# Patient Record
Sex: Female | Born: 1970 | Race: White | Hispanic: No | Marital: Single
Health system: Southern US, Community
[De-identification: ages and names within clinical notes are randomized; demographics above are authoritative.]

---

## 2007-12-31 ENCOUNTER — Other Ambulatory Visit: Admission: RE | Admit: 2007-12-31 | Discharge: 2007-12-31 | Payer: Self-pay | Admitting: Family Medicine

## 2009-08-09 ENCOUNTER — Ambulatory Visit: Payer: Self-pay | Admitting: Internal Medicine

## 2009-12-26 ENCOUNTER — Ambulatory Visit: Payer: Self-pay | Admitting: Gastroenterology

## 2010-07-09 IMAGING — MG MAM DGTL SCREENING MAMMO W/CAD
1 series · 4 of 4 positions shown · non-contrast
Comparison: none

REASON FOR EXAM: BASELINE
COMMENTS:

PROCEDURE:     MAM - MAM DGTL SCREENING MAMMO W/CAD  - August 09, 2009 [DATE]
RESULT:       Calcifications are noted on the right.  No mass lesions or
pathologic clustered calcifications demonstrated. CAD evaluation is
nonfocal.

[R CC · right · 4 of 4 slices shown]
[im 1/4]
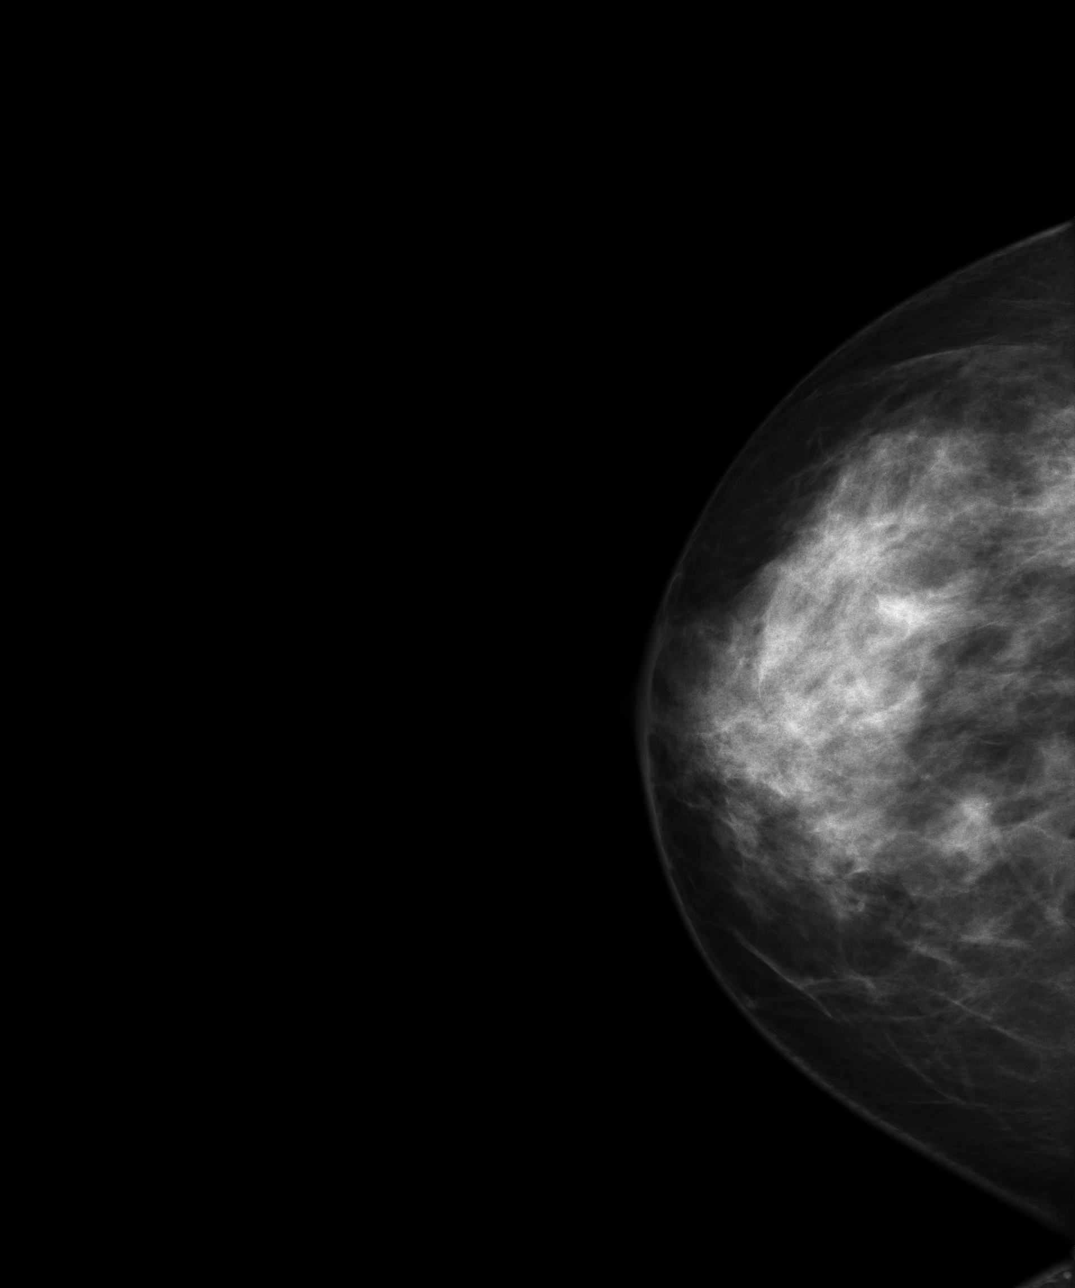
[im 2/4]
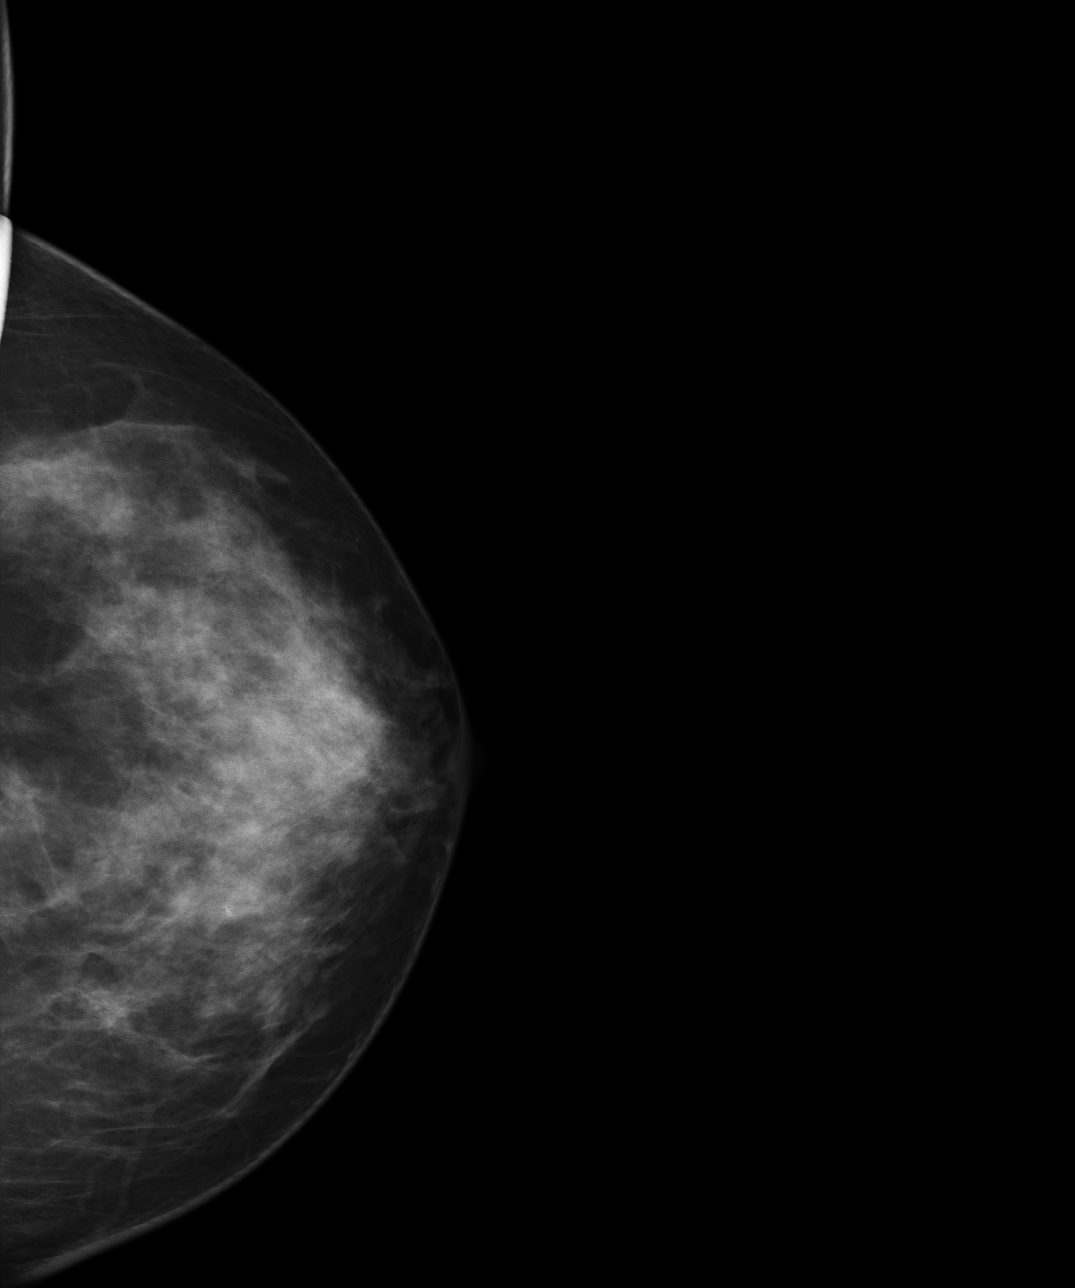
[im 3/4]
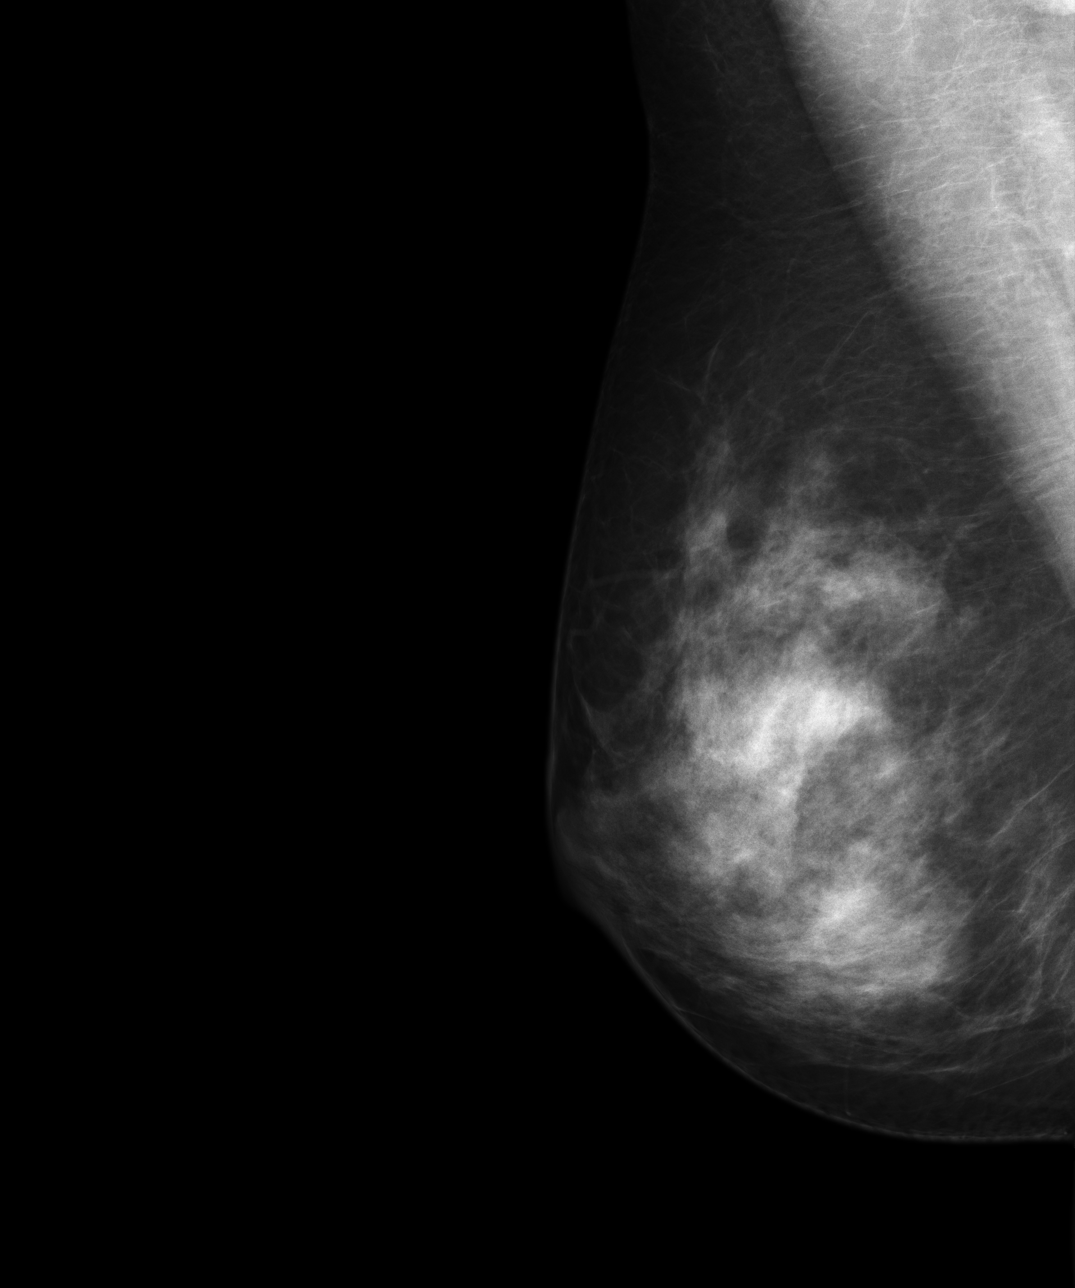
[im 4/4]
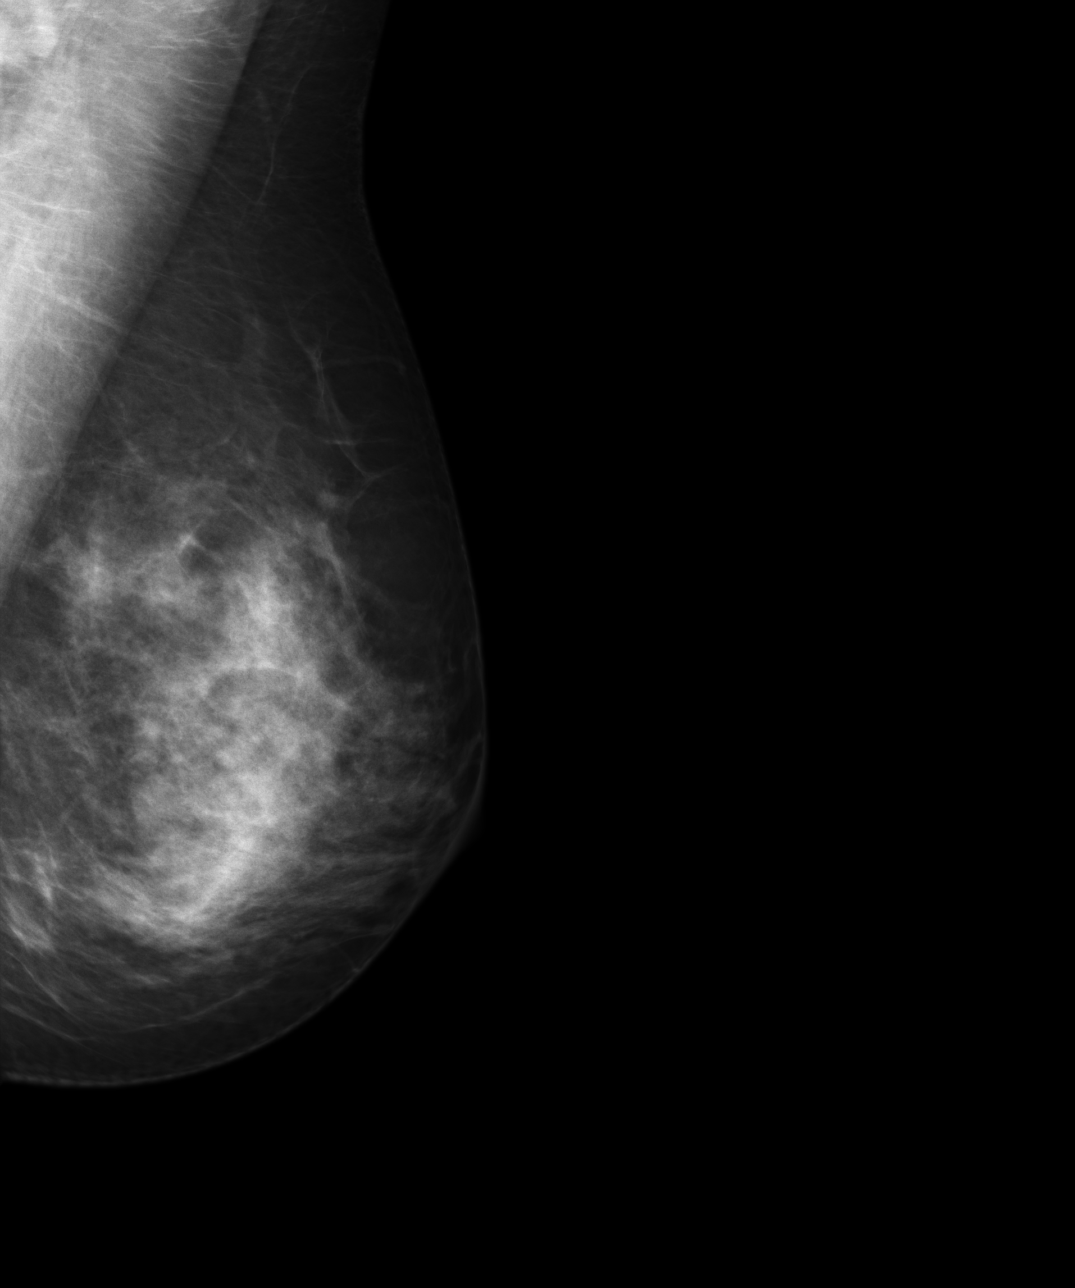

[4 of 4 positions shown; findings below may reference images not displayed]

IMPRESSION: 1.     Benign exam.
2.     Routine yearly follow up exam is suggested.
3.     BI-RADS:  Category 2- Benign Finding.

A negative mammogram report does not preclude biopsy or other evaluation of
a clinically palpable or otherwise suspicious mass or lesion. Breast cancer
may not be detected by mammography in up to 10% of cases.

## 2011-05-20 ENCOUNTER — Ambulatory Visit: Payer: Self-pay | Admitting: Internal Medicine

## 2012-04-18 IMAGING — MG MAM DGTL SCREENING MAMMO W/CAD
1 series · 4 of 4 positions shown · non-contrast
Comparison: none

REASON FOR EXAM: scr mammo
COMMENTS:

PROCEDURE:     MAM - MAM DGTL SCREENING MAMMO W/CAD  - May 20, 2011 [DATE]
RESULT:     COMPARISON:  08/09/2009
TECHNIQUE: Digital screening mammograms were obtained. FDA approved
computer-aided detection (CAD) for mammography was utilized for this study.

[Series 4246: R CC · right · 4 of 4 slices shown]
[im 1/4]
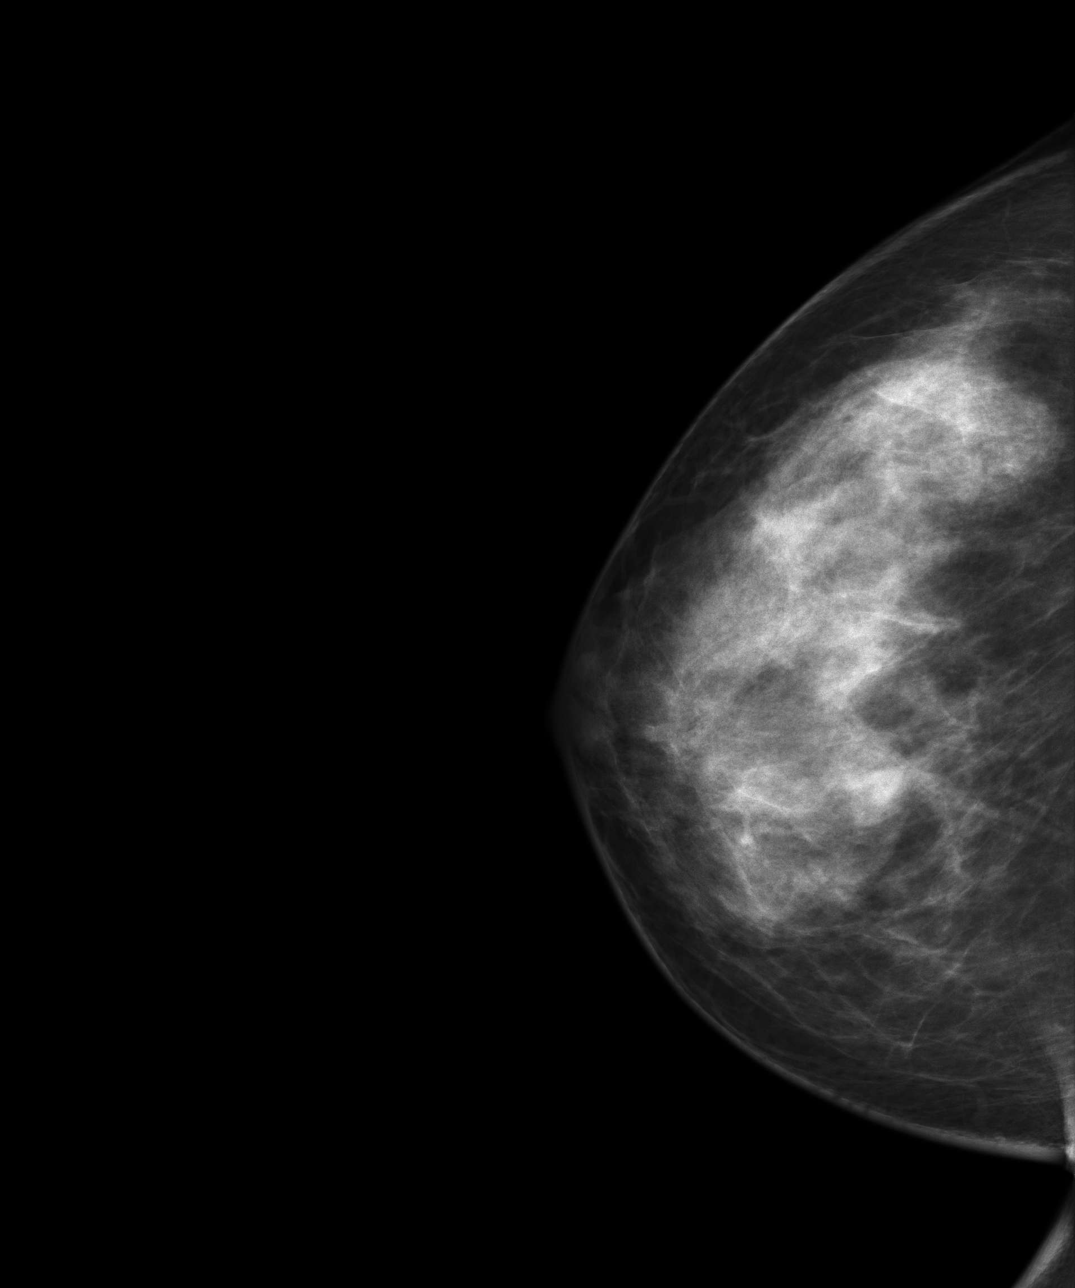
[im 2/4]
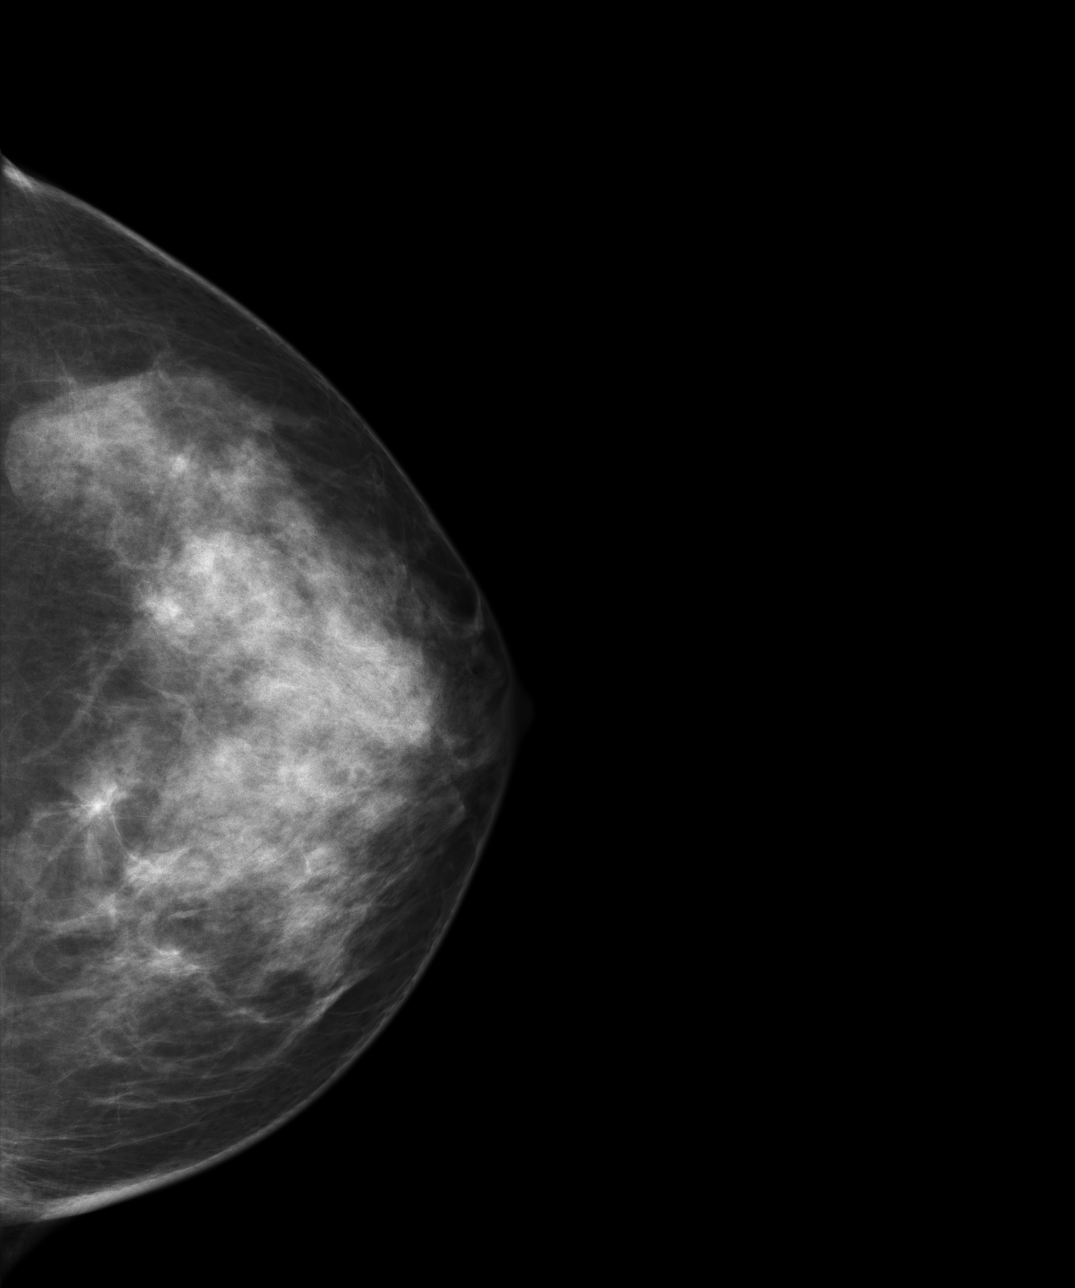
[im 3/4]
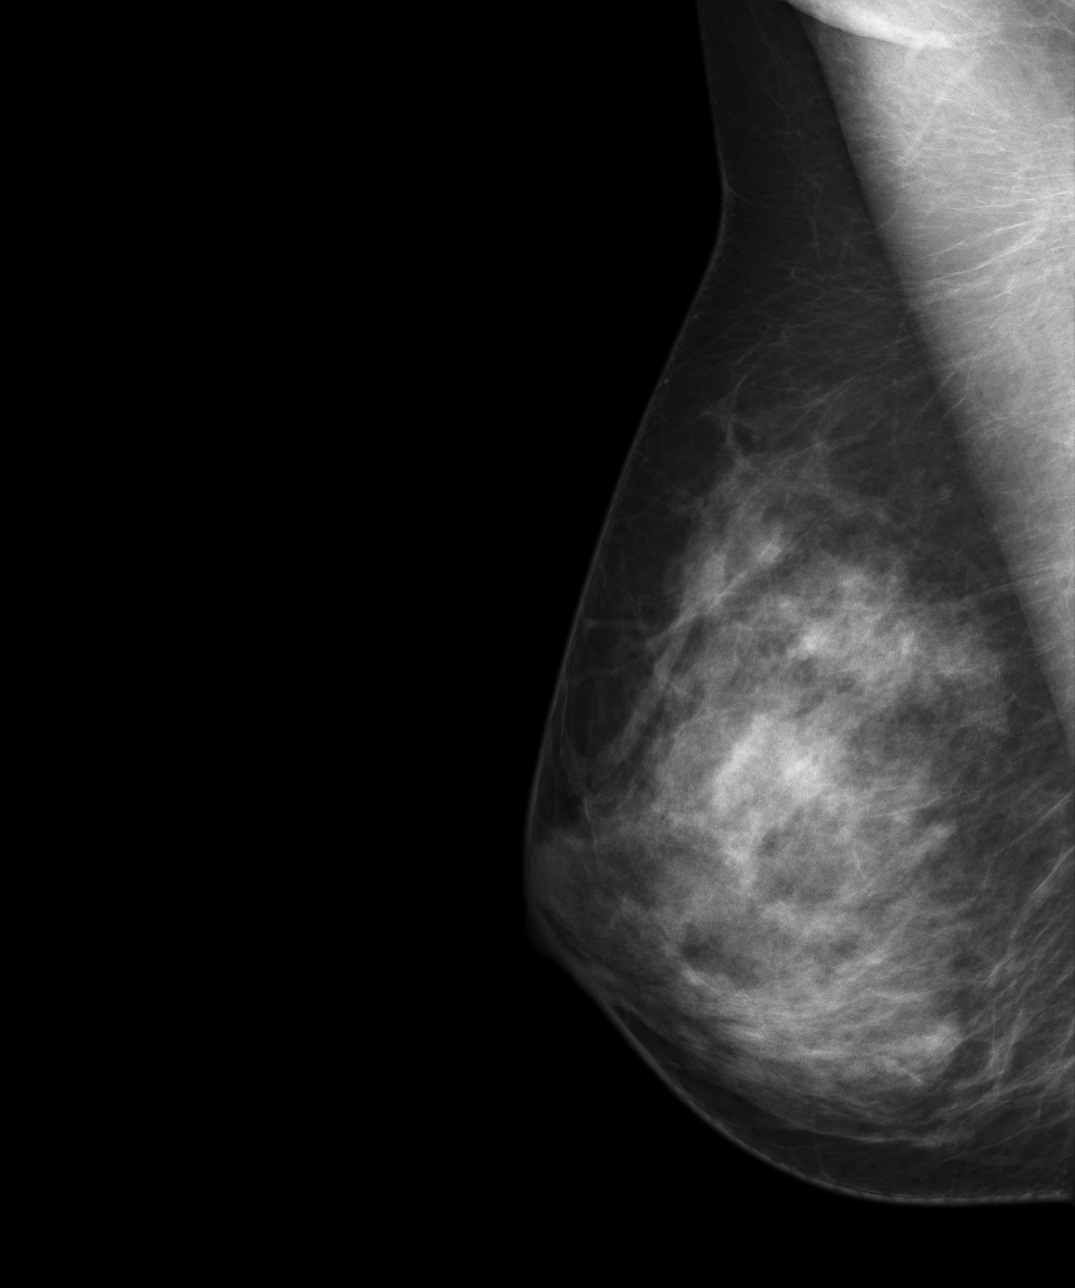
[im 4/4]
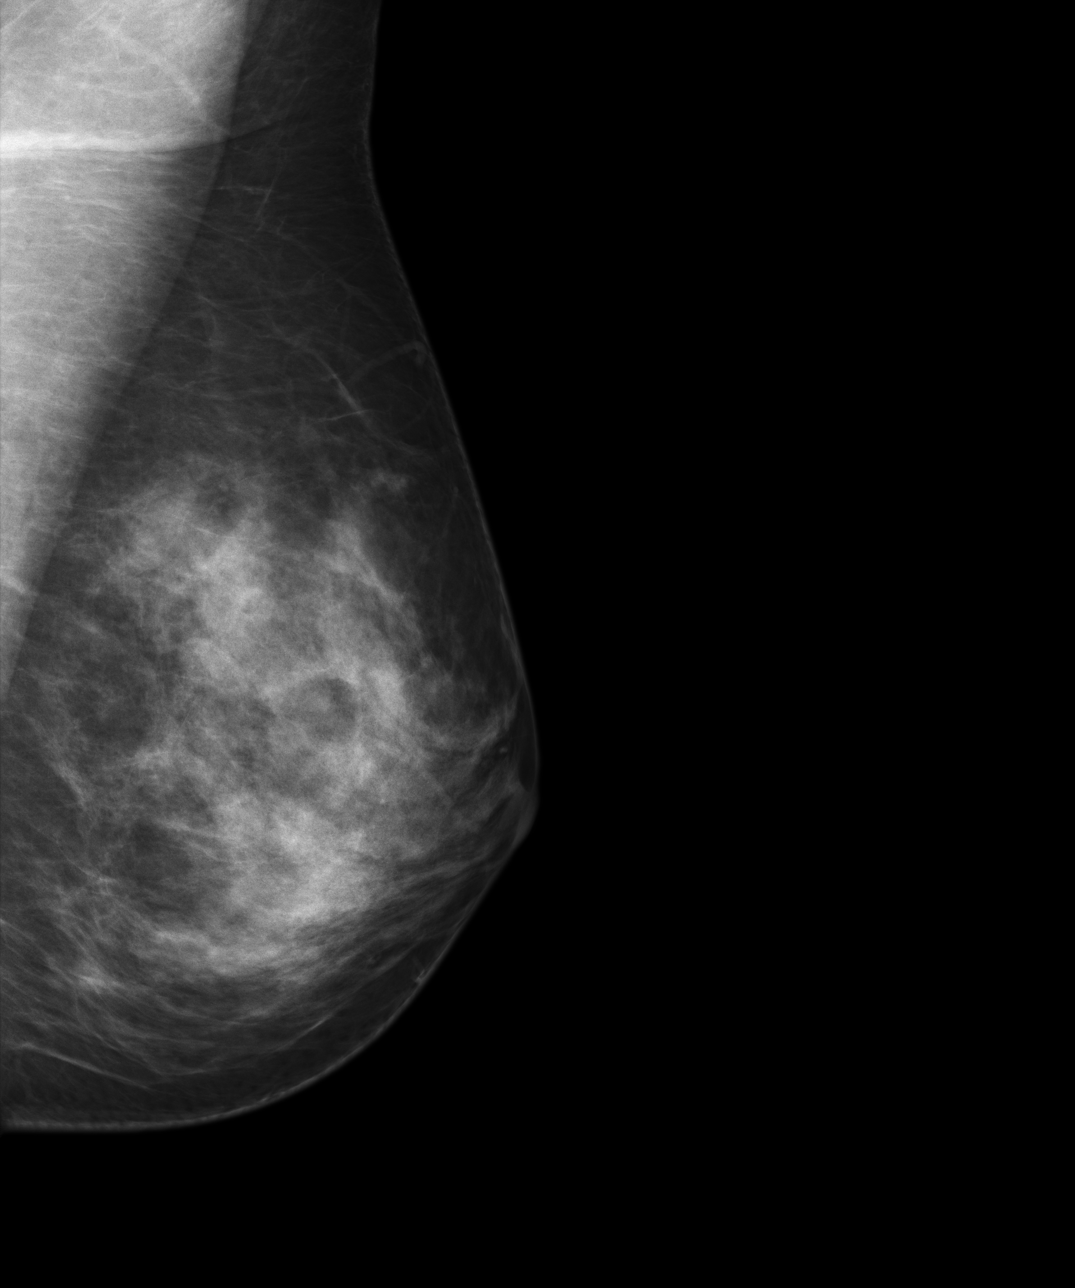

[4 of 4 positions shown; findings below may reference images not displayed]

FINDING: Bilateral breasts are heterogeneously dense which may lower the sensitivity
of mammography.  There is no dominant mass, architectural distortion or
clusters of suspicious microcalcifications.
IMPRESSION: 1.     Stable bilateral mammogram.
2.     Annual mammographic follow up recommended.
3.     BI-RADS:  Category 2- Benign.

A negative mammogram report does not preclude biopsy or other evaluation of
a clinically palpable or otherwise suspicious mass or lesion. Breast cancer
may not be detected by mammography in up to 10% of cases.

## 2012-12-10 ENCOUNTER — Ambulatory Visit: Payer: Self-pay | Admitting: Internal Medicine

## 2014-03-30 ENCOUNTER — Ambulatory Visit: Payer: Self-pay | Admitting: Internal Medicine

## 2015-02-27 IMAGING — MG MAM DGTL SCRN MAM NO ORDER W/CAD
1 series · 4 of 4 positions shown · non-contrast
Comparison: Previous exam(s).

CLINICAL DATA: Screening.

EXAM:
DIGITAL SCREENING BILATERAL MAMMOGRAM WITH CAD

[R CC · right · 4 of 4 slices shown]
[im 1/4]
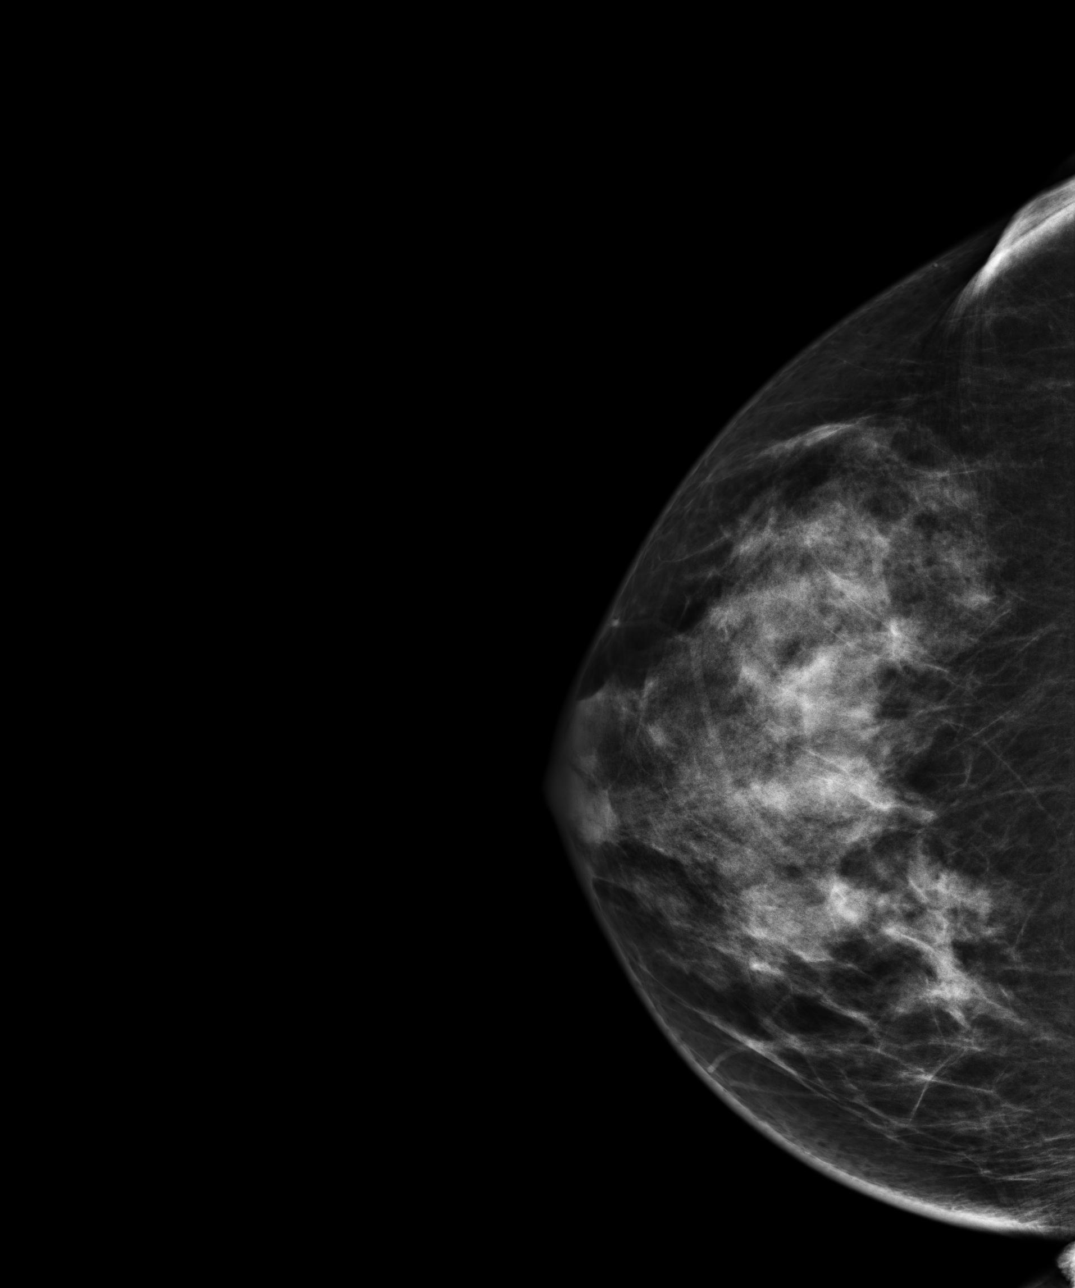
[im 2/4]
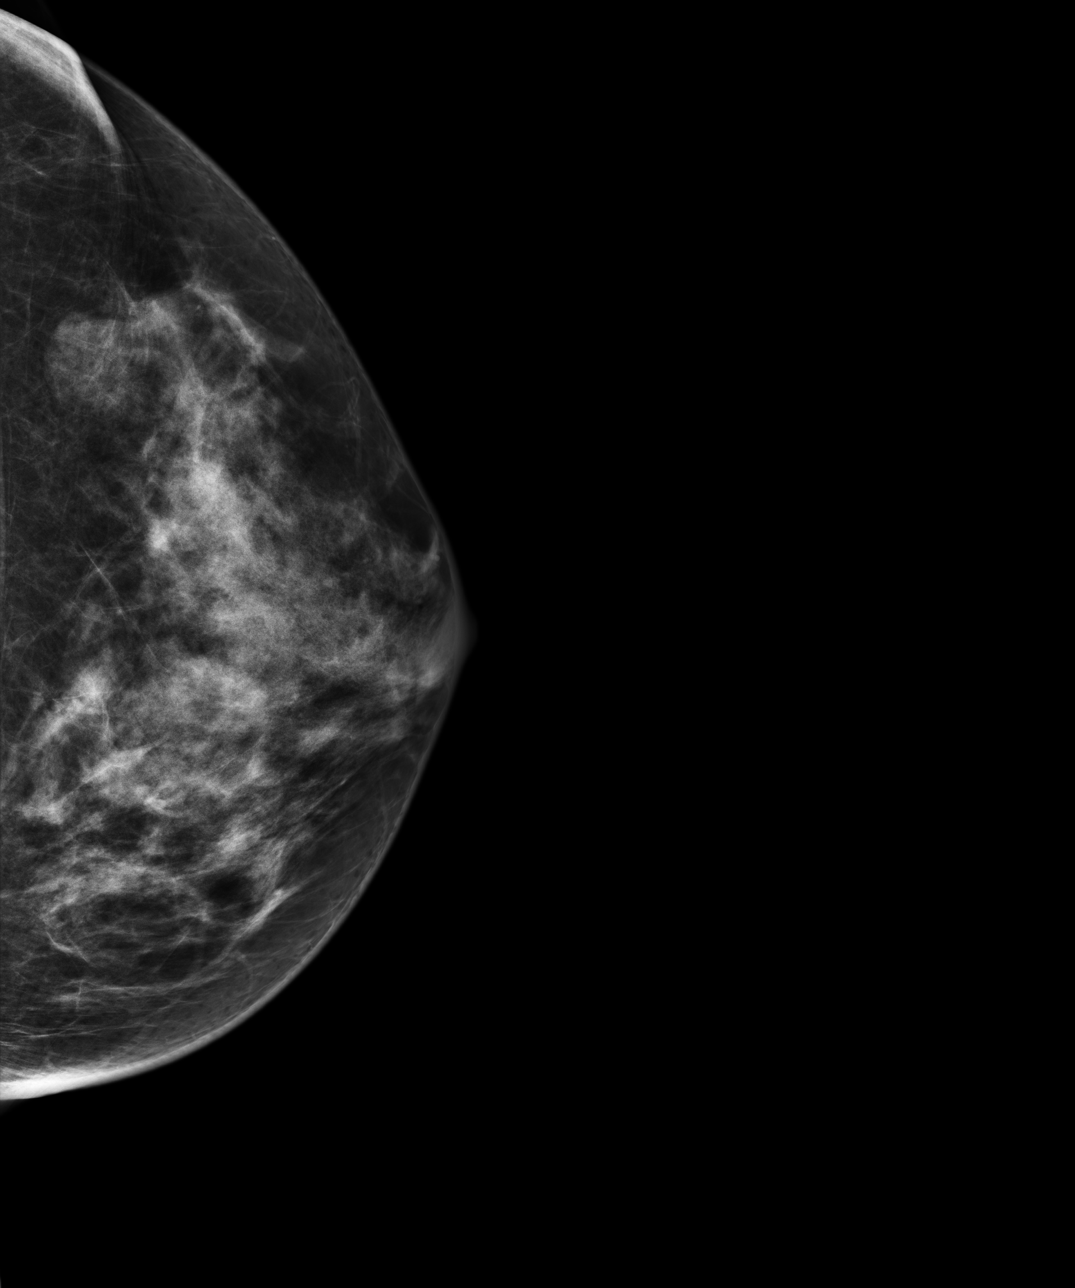
[im 3/4]
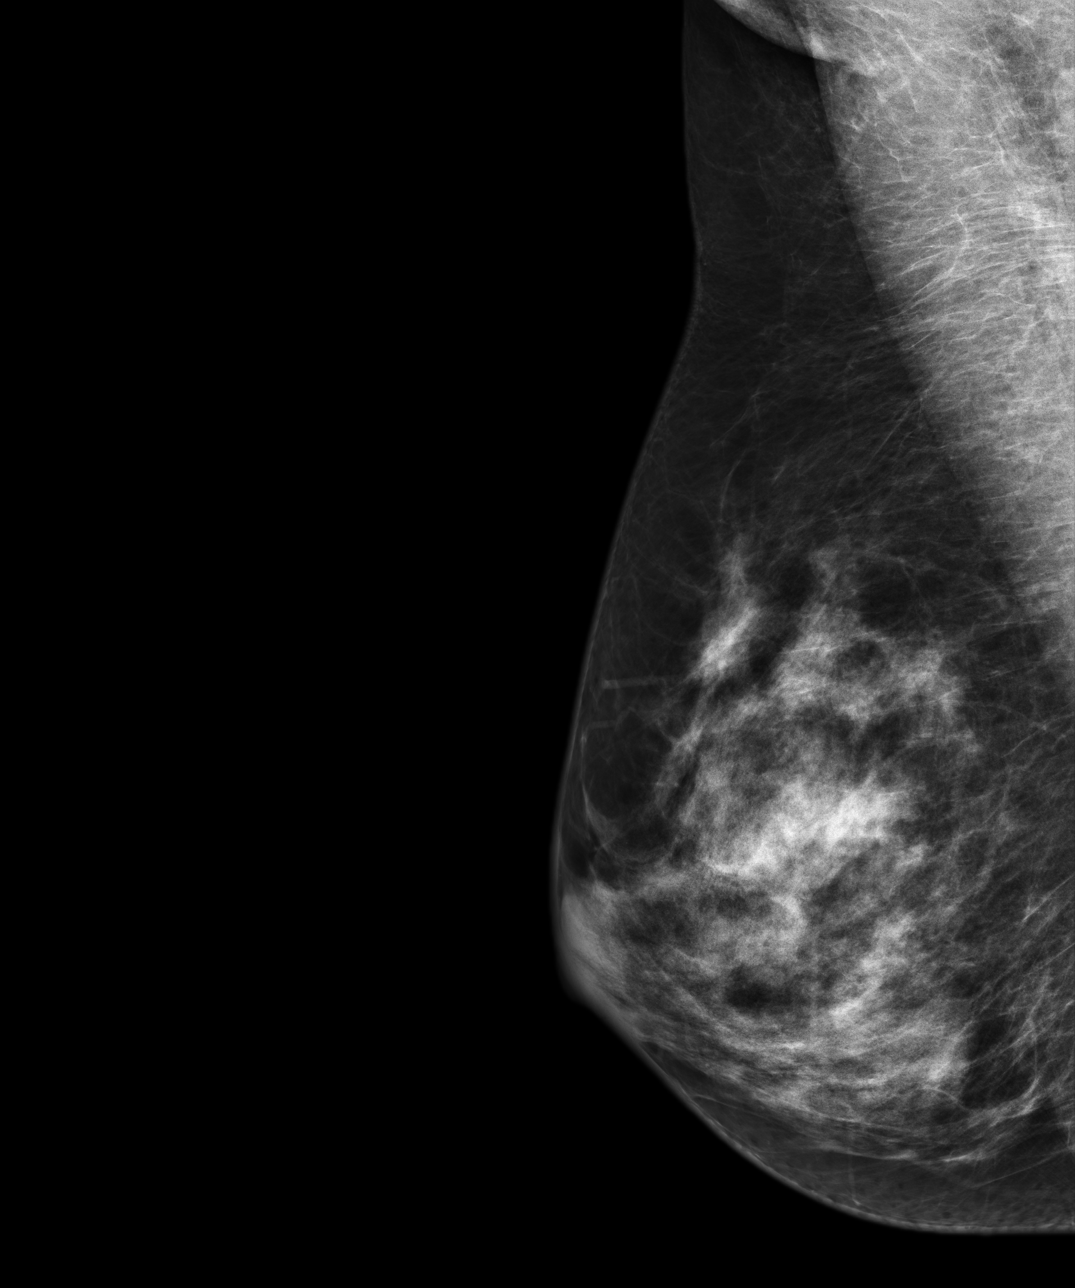
[im 4/4]
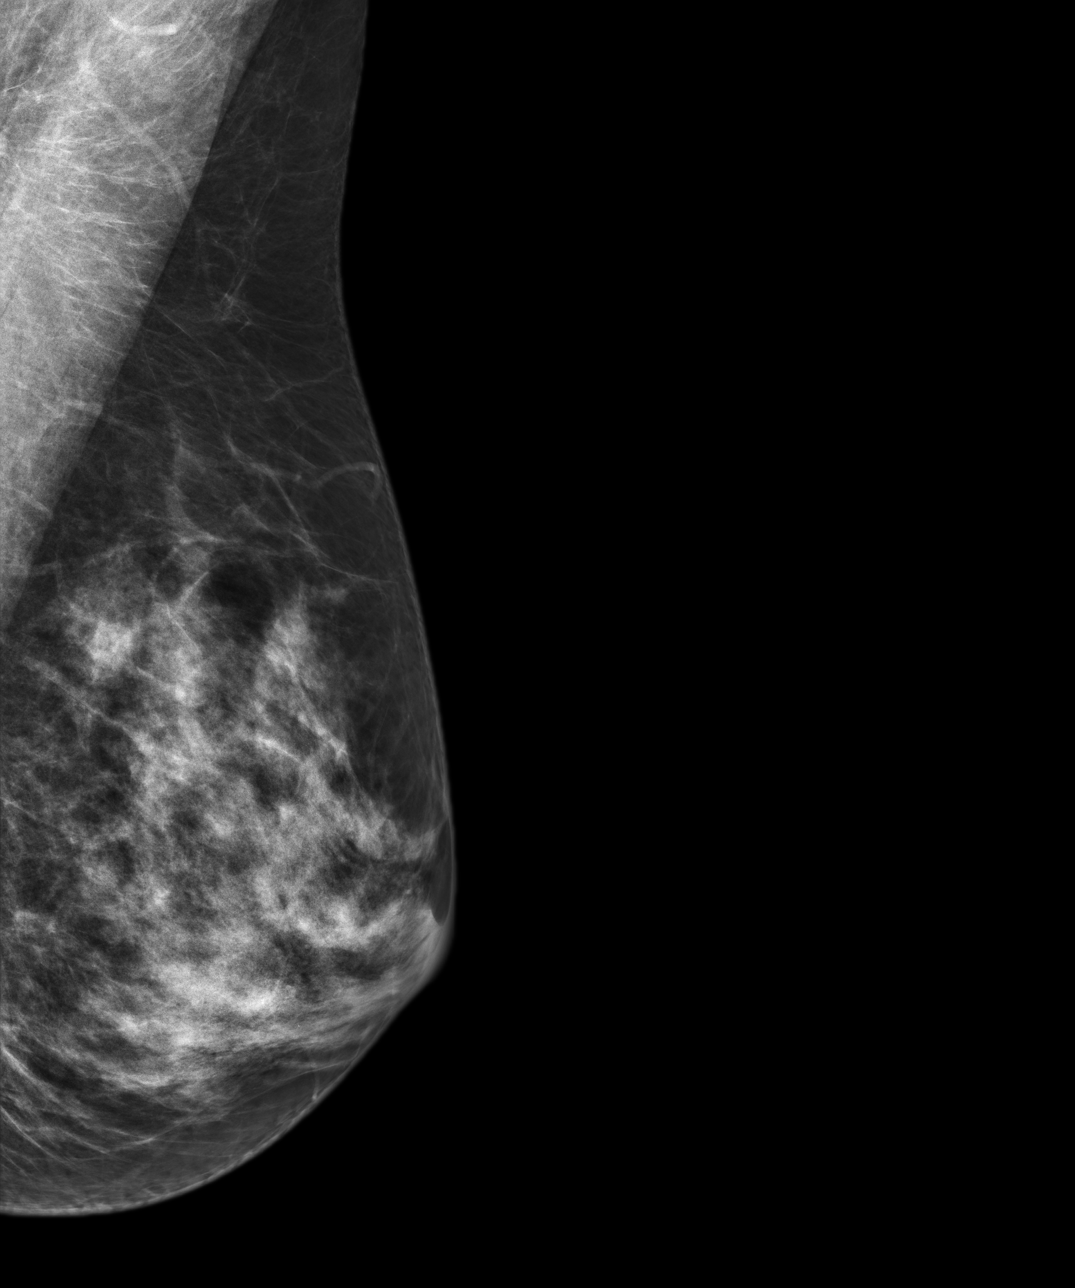

[4 of 4 positions shown; findings below may reference images not displayed]

ACR Breast Density Category c: The breast tissue is heterogeneously
dense, which may obscure small masses.
FINDINGS: There are no findings suspicious for malignancy. Images were
processed with CAD.
IMPRESSION: No mammographic evidence of malignancy. A result letter of this
screening mammogram will be mailed directly to the patient.

RECOMMENDATION:
Screening mammogram in one year. (Code:[id number])

BI-RADS CATEGORY  1: Negative.

## 2020-04-12 ENCOUNTER — Telehealth: Payer: Self-pay | Admitting: Nurse Practitioner

## 2020-04-12 NOTE — Telephone Encounter (Signed)
Called to discuss with Illana Chaviano about Covid symptoms and the use of casirivimab/imdevimab, a combination monoclonal antibody infusion for those with mild to moderate Covid symptoms and at a high risk of hospitalization.     Pt is qualified for this infusion at the [facility] infusion center due to co-morbid conditions (asthma).   Will need to speak with patient further to determine symptom onset. Unable to reach. Voicemail left.   Willette Alma, AGPCNP-BC

## 2020-04-13 ENCOUNTER — Other Ambulatory Visit: Payer: Self-pay | Admitting: Internal Medicine

## 2020-04-13 DIAGNOSIS — U071 COVID-19: Secondary | ICD-10-CM

## 2020-04-13 DIAGNOSIS — J45909 Unspecified asthma, uncomplicated: Secondary | ICD-10-CM

## 2020-04-13 NOTE — Progress Notes (Unsigned)
I connected by phone with Chelsea Boyd on 04/13/2020 at 10:11 AM to discuss the potential use of a new treatment for mild to moderate COVID-19 viral infection in non-hospitalized patients.  This patient is a 49 y.o. female that meets the FDA criteria for Emergency Use Authorization of COVID monoclonal antibody casirivimab/imdevimab.  Has a (+) direct SARS-CoV-2 viral test result  Has mild or moderate COVID-19   Is NOT hospitalized due to COVID-19  Is within 10 days of symptom onset  Has at least one of the high risk factor(s) for progression to severe COVID-19 and/or hospitalization as defined in EUA.  Specific high risk criteria : chronic asthma   I have spoken and communicated the following to the patient or parent/caregiver regarding COVID monoclonal antibody treatment:  1. FDA has authorized the emergency use for the treatment of mild to moderate COVID-19 in adults and pediatric patients with positive results of direct SARS-CoV-2 viral testing who are 50 years of age and older weighing at least 40 kg, and who are at high risk for progressing to severe COVID-19 and/or hospitalization.  2. The significant known and potential risks and benefits of COVID monoclonal antibody, and the extent to which such potential risks and benefits are unknown.  3. Information on available alternative treatments and the risks and benefits of those alternatives, including clinical trials.  4. Patients treated with COVID monoclonal antibody should continue to self-isolate and use infection control measures (e.g., wear mask, isolate, social distance, avoid sharing personal items, clean and disinfect "high touch" surfaces, and frequent handwashing) according to CDC guidelines.   5. The patient or parent/caregiver has the option to accept or refuse COVID monoclonal antibody treatment.  After reviewing this information with the patient, The patient agreed to proceed with receiving casirivimab\imdevimab  infusion and will be provided a copy of the Fact sheet prior to receiving the infusion.   Mab infusion scheduled for 9/3 at 0830.  This will be day 6 of symptom onset.  Patient given appointment details and sent MyChart activation code for further details to be sent.  Marcy Salvo, NP 04/13/2020 10:11 AM

## 2020-04-14 ENCOUNTER — Ambulatory Visit (HOSPITAL_COMMUNITY)
Admission: RE | Admit: 2020-04-14 | Discharge: 2020-04-14 | Disposition: A | Discharge status: Home / Self Care | Payer: 59 | Source: Ambulatory Visit | Attending: Pulmonary Disease | Admitting: Pulmonary Disease

## 2020-04-14 DIAGNOSIS — J45909 Unspecified asthma, uncomplicated: Secondary | ICD-10-CM | POA: Insufficient documentation

## 2020-04-14 DIAGNOSIS — U071 COVID-19: Secondary | ICD-10-CM | POA: Diagnosis present

## 2020-04-14 MED ORDER — METHYLPREDNISOLONE SODIUM SUCC 125 MG IJ SOLR: 125.0000 mg | Freq: Once | INTRAMUSCULAR | Status: DC | PRN

## 2020-04-14 MED ORDER — DIPHENHYDRAMINE HCL 50 MG/ML IJ SOLN: 50.0000 mg | Freq: Once | INTRAMUSCULAR | Status: DC | PRN

## 2020-04-14 MED ORDER — SODIUM CHLORIDE 0.9 % IV SOLN: INTRAVENOUS | Status: DC | PRN

## 2020-04-14 MED ORDER — ALBUTEROL SULFATE HFA 108 (90 BASE) MCG/ACT IN AERS: 2.0000 | INHALATION_SPRAY | Freq: Once | RESPIRATORY_TRACT | Status: DC | PRN

## 2020-04-14 MED ORDER — FAMOTIDINE IN NACL 20-0.9 MG/50ML-% IV SOLN: 20.0000 mg | Freq: Once | INTRAVENOUS | Status: DC | PRN

## 2020-04-14 MED ORDER — EPINEPHRINE 0.3 MG/0.3ML IJ SOAJ: 0.3000 mg | Freq: Once | INTRAMUSCULAR | Status: DC | PRN

## 2020-04-14 MED ORDER — SODIUM CHLORIDE 0.9 % IV SOLN
1200.0000 mg | Freq: Once | INTRAVENOUS | Status: AC
  Administered 2020-04-14: 1200 mg via INTRAVENOUS
  Filled 2020-04-14: qty 10

## 2020-04-14 NOTE — Discharge Instructions (Signed)
10 Things You Can Do to Manage Your COVID-19 Symptoms at Home If you have possible or confirmed COVID-19: 1. Stay home from work and school. And stay away from other public places. If you must go out, avoid using any kind of public transportation, ridesharing, or taxis. 2. Monitor your symptoms carefully. If your symptoms get worse, call your healthcare provider immediately. 3. Get rest and stay hydrated. 4. If you have a medical appointment, call the healthcare provider ahead of time and tell them that you have or may have COVID-19. 5. For medical emergencies, call 911 and notify the dispatch personnel that you have or may have COVID-19. 6. Cover your cough and sneezes with a tissue or use the inside of your elbow. 7. Wash your hands often with soap and water for at least 20 seconds or clean your hands with an alcohol-based hand sanitizer that contains at least 60% alcohol. 8. As much as possible, stay in a specific room and away from other people in your home. Also, you should use a separate bathroom, if available. If you need to be around other people in or outside of the home, wear a mask. 9. Avoid sharing personal items with other people in your household, like dishes, towels, and bedding. 10. Clean all surfaces that are touched often, like counters, tabletops, and doorknobs. Use household cleaning sprays or wipes according to the label instructions. [email or web address] 02/10/2019 This information is not intended to replace advice given to you by your health care provider. Make sure you discuss any questions you have with your health care provider. Document Revised: 07/15/2019 Document Reviewed: 07/15/2019 Elsevier Patient Education  [place] Can Do to Manage Your COVID-19 Symptoms at Home If you have possible or confirmed COVID-19: 11. Stay home from work and school. And stay away from other public places. If you must go out, avoid using any kind of public  transportation, ridesharing, or taxis. 12. Monitor your symptoms carefully. If your symptoms get worse, call your healthcare provider immediately. 13. Get rest and stay hydrated. 14. If you have a medical appointment, call the healthcare provider ahead of time and tell them that you have or may have COVID-19. 15. For medical emergencies, call 911 and notify the dispatch personnel that you have or may have COVID-19. 16. Cover your cough and sneezes with a tissue or use the inside of your elbow. 29. Wash your hands often with soap and water for at least 20 seconds or clean your hands with an alcohol-based hand sanitizer that contains at least 60% alcohol. 18. As much as possible, stay in a specific room and away from other people in your home. Also, you should use a separate bathroom, if available. If you need to be around other people in or outside of the home, wear a mask. 19. Avoid sharing personal items with other people in your household, like dishes, towels, and bedding. 20. Clean all surfaces that are touched often, like counters, tabletops, and doorknobs. Use household cleaning sprays or wipes according to the label instructions. [email or web address] 02/10/2019 This information is not intended to replace advice given to you by your health care provider. Make sure you discuss any questions you have with your health care provider. Document Revised: 07/15/2019 Document Reviewed: 07/15/2019 Elsevier Patient Education  [place]. What types of side effects do monoclonal antibody drugs cause?  Common side effects  In general, the more common side effects caused by monoclonal antibody drugs include: . Allergic reactions,  such as hives or itching . Flu-like signs and symptoms, including chills, fatigue, fever, and muscle aches and pains . Nausea, vomiting . Diarrhea . Skin rashes . Low blood pressure   The CDC is recommending patients who receive monoclonal antibody treatments wait  at least 90 days before being vaccinated.  Currently, there are no data on the safety and efficacy of mRNA COVID-19 vaccines in persons who received monoclonal antibodies or convalescent plasma as part of COVID-19 treatment. Based on the estimated half-life of such therapies as well as evidence suggesting that reinfection is uncommon in the 90 days after initial infection, vaccination should be deferred for at least 90 days, as a precautionary measure until additional information becomes available, to avoid interference of the antibody treatment with vaccine-induced immune responses. 10 Things You Can Do to Manage Your COVID-19 Symptoms at Home If you have possible or confirmed COVID-19: 21. Stay home from work and school. And stay away from other public places. If you must go out, avoid using any kind of public transportation, ridesharing, or taxis. 22. Monitor your symptoms carefully. If your symptoms get worse, call your healthcare provider immediately. 23. Get rest and stay hydrated. 24. If you have a medical appointment, call the healthcare provider ahead of time and tell them that you have or may have COVID-19. 25. For medical emergencies, call 911 and notify the dispatch personnel that you have or may have COVID-19. 26. Cover your cough and sneezes with a tissue or use the inside of your elbow. 27. Wash your hands often with soap and water for at least 20 seconds or clean your hands with an alcohol-based hand sanitizer that contains at least 60% alcohol. 28. As much as possible, stay in a specific room and away from other people in your home. Also, you should use a separate bathroom, if available. If you need to be around other people in or outside of the home, wear a mask. 29. Avoid sharing personal items with other people in your household, like dishes, towels, and bedding. 30. Clean all surfaces that are touched often, like counters, tabletops, and doorknobs. Use household cleaning sprays or  wipes according to the label instructions. [email or web address] 02/10/2019 This information is not intended to replace advice given to you by your health care provider. Make sure you discuss any questions you have with your health care provider. Document Revised: 07/15/2019 Document Reviewed: 07/15/2019 Elsevier Patient Education  2020 [employer]. What types of side effects do monoclonal antibody drugs cause?  Common side effects  In general, the more common side effects caused by monoclonal antibody drugs include: . Allergic reactions, such as hives or itching . Flu-like signs and symptoms, including chills, fatigue, fever, and muscle aches and pains . Nausea, vomiting . Diarrhea . Skin rashes . Low blood pressure  What types of side effects do monoclonal antibody drugs cause?  Common side effects  In general, the more common side effects caused by monoclonal antibody drugs include: . Allergic reactions, such as hives or itching . Flu-like signs and symptoms, including chills, fatigue, fever, and muscle aches and pains . Nausea, vomiting . Diarrhea . Skin rashes . Low blood pressure   The CDC is recommending patients who receive monoclonal antibody treatments wait at least 90 days before being vaccinated.  Currently, there are no data on the safety and efficacy of mRNA COVID-19 vaccines in persons who received monoclonal antibodies or convalescent plasma as part of COVID-19 treatment. Based on the estimated half-life of such  therapies as well as evidence suggesting that reinfection is uncommon in the 90 days after initial infection, vaccination should be deferred for at least 90 days, as a precautionary measure until additional information becomes available, to avoid interference of the antibody treatment with vaccine-induced immune responses. What types of side effects do monoclonal antibody drugs cause?  Common side effects  In general, the more common side effects caused by  monoclonal antibody drugs include: . Allergic reactions, such as hives or itching . Flu-like signs and symptoms, including chills, fatigue, fever, and muscle aches and pains . Nausea, vomiting . Diarrhea . Skin rashes . Low blood pressure   The CDC is recommending patients who receive monoclonal antibody treatments wait at least 90 days before being vaccinated.  Currently, there are no data on the safety and efficacy of mRNA COVID-19 vaccines in persons who received monoclonal antibodies or convalescent plasma as part of COVID-19 treatment. Based on the estimated half-life of such therapies as well as evidence suggesting that reinfection is uncommon in the 90 days after initial infection, vaccination should be deferred for at least 90 days, as a precautionary measure until additional information becomes available, to avoid interference of the antibody treatment with vaccine-induced immune responses. The CDC is recommending patients who receive monoclonal antibody treatments wait at least 90 days before being vaccinated.  Currently, there are no data on the safety and efficacy of mRNA COVID-19 vaccines in persons who received monoclonal antibodies or convalescent plasma as part of COVID-19 treatment. Based on the estimated half-life of such therapies as well as evidence suggesting that reinfection is uncommon in the 90 days after initial infection, vaccination should be deferred for at least 90 days, as a precautionary measure until additional information becomes available, to avoid interference of the antibody treatment with vaccine-induced immune responses.

## 2020-04-14 NOTE — Progress Notes (Signed)
  Diagnosis: COVID-19  Physician: Dr. Patrick Wright  Procedure: [facility] Infusion [facility] Med: casirivimab\imdevimab infusion - Provided patient with casirivimab\imdevimab fact sheet for patients, parents and caregivers prior to infusion.  Complications: No immediate complications noted.  Discharge: Discharged home   Ally Yow 04/14/2020   

## 2022-12-30 ENCOUNTER — Other Ambulatory Visit: Payer: Self-pay | Admitting: Obstetrics and Gynecology

## 2022-12-30 DIAGNOSIS — R928 Other abnormal and inconclusive findings on diagnostic imaging of breast: Secondary | ICD-10-CM

## 2023-01-17 ENCOUNTER — Ambulatory Visit
Admission: RE | Admit: 2023-01-17 | Discharge: 2023-01-17 | Disposition: A | Discharge status: Home / Self Care | Payer: 59 | Source: Ambulatory Visit | Attending: Obstetrics and Gynecology | Admitting: Obstetrics and Gynecology

## 2023-01-17 ENCOUNTER — Ambulatory Visit: Payer: Self-pay

## 2023-01-17 DIAGNOSIS — R928 Other abnormal and inconclusive findings on diagnostic imaging of breast: Secondary | ICD-10-CM
